# Patient Record
Sex: Female | Born: 1945 | Race: Black or African American | Hispanic: No | Smoking: Never smoker
Health system: Southern US, Community
[De-identification: ages and names within clinical notes are randomized; demographics above are authoritative.]

## PROBLEM LIST (undated history)

## (undated) DIAGNOSIS — K219 Gastro-esophageal reflux disease without esophagitis: Secondary | ICD-10-CM

## (undated) DIAGNOSIS — I10 Essential (primary) hypertension: Secondary | ICD-10-CM

## (undated) HISTORY — PX: BUNIONECTOMY: SHX129

## (undated) HISTORY — PX: ABDOMINAL HYSTERECTOMY: SHX81

---

## 2005-10-01 ENCOUNTER — Ambulatory Visit (HOSPITAL_BASED_OUTPATIENT_CLINIC_OR_DEPARTMENT_OTHER): Admission: RE | Admit: 2005-10-01 | Discharge: 2005-10-01 | Payer: Self-pay | Admitting: Orthopedic Surgery

## 2017-06-28 ENCOUNTER — Emergency Department (HOSPITAL_BASED_OUTPATIENT_CLINIC_OR_DEPARTMENT_OTHER): Payer: Medicare Other

## 2017-06-28 ENCOUNTER — Emergency Department (HOSPITAL_BASED_OUTPATIENT_CLINIC_OR_DEPARTMENT_OTHER)
Admission: EM | Admit: 2017-06-28 | Discharge: 2017-06-28 | Disposition: A | Discharge status: Home / Self Care | Payer: Medicare Other | Attending: Emergency Medicine | Admitting: Emergency Medicine

## 2017-06-28 ENCOUNTER — Encounter (HOSPITAL_BASED_OUTPATIENT_CLINIC_OR_DEPARTMENT_OTHER): Payer: Self-pay | Admitting: *Deleted

## 2017-06-28 DIAGNOSIS — Y999 Unspecified external cause status: Secondary | ICD-10-CM | POA: Insufficient documentation

## 2017-06-28 DIAGNOSIS — Y92511 Restaurant or cafe as the place of occurrence of the external cause: Secondary | ICD-10-CM | POA: Insufficient documentation

## 2017-06-28 DIAGNOSIS — S8992XA Unspecified injury of left lower leg, initial encounter: Secondary | ICD-10-CM | POA: Diagnosis not present

## 2017-06-28 DIAGNOSIS — Y939 Activity, unspecified: Secondary | ICD-10-CM | POA: Diagnosis not present

## 2017-06-28 DIAGNOSIS — W19XXXA Unspecified fall, initial encounter: Secondary | ICD-10-CM

## 2017-06-28 DIAGNOSIS — S99912A Unspecified injury of left ankle, initial encounter: Secondary | ICD-10-CM | POA: Insufficient documentation

## 2017-06-28 DIAGNOSIS — Z79899 Other long term (current) drug therapy: Secondary | ICD-10-CM | POA: Diagnosis not present

## 2017-06-28 DIAGNOSIS — S0990XA Unspecified injury of head, initial encounter: Secondary | ICD-10-CM

## 2017-06-28 DIAGNOSIS — W01198A Fall on same level from slipping, tripping and stumbling with subsequent striking against other object, initial encounter: Secondary | ICD-10-CM | POA: Insufficient documentation

## 2017-06-28 DIAGNOSIS — S098XXA Other specified injuries of head, initial encounter: Secondary | ICD-10-CM | POA: Insufficient documentation

## 2017-06-28 HISTORY — DX: Gastro-esophageal reflux disease without esophagitis: K21.9

## 2017-06-28 HISTORY — DX: Essential (primary) hypertension: I10

## 2017-06-28 NOTE — ED Triage Notes (Signed)
Fall on pavement.  Noted to have a large hematoma to her forehead.  Denies LOC. Abrasion noted with bleeding controlled.

## 2017-06-28 NOTE — ED Notes (Signed)
EDPA in to see pt, updated with results and d/c plan.

## 2017-06-28 NOTE — ED Notes (Signed)
Pt/family left after speaking with PA. Pt not visualized by this RN. Recent VSS.

## 2017-06-28 NOTE — Discharge Instructions (Signed)
Return here as needed. Follow up with your doctor. Your x-rays were normal. °

## 2017-07-02 NOTE — ED Provider Notes (Signed)
[facility] EMERGENCY DEPARTMENT Provider Note   CSN: [id number] Arrival date & time: 06/28/17  1652     History   Chief Complaint Chief Complaint  Patient presents with  . Fall    HPI Lisa Lutz is a 71 y.o. female.  HPI Patient presents to the emergency department with injuries following a fall that occurred just prior to arrival.  The patient states that she was walking into a restaurant when she tripped and fell striking her forehead onto the pavement.  Patient states that she did not lose consciousness and her family confirms this.  The patient states she is also having left ankle and knee pain.  She states she was able to ambulate following the fall.  Patient did not take any medications prior to arrival.  The patient denies chest pain, shortness of breath, headache,blurred vision, neck pain, weakness, numbness, dizziness, abdominal pain, nausea, vomiting,back pain, dysuria,near syncope, or syncope. Past Medical History:  Diagnosis Date  . Acid reflux   . Hypertension     There are no active problems to display for this patient.   Past Surgical History:  Procedure Laterality Date  . ABDOMINAL HYSTERECTOMY    . BUNIONECTOMY      OB History    No data available       Home Medications    Prior to Admission medications   Medication Sig Start Date End Date Taking? Authorizing Provider  amLODipine (NORVASC) 10 MG tablet Take 10 mg by mouth daily.   Yes [provider]  irbesartan (AVAPRO) 300 MG tablet Take 300 mg by mouth daily.   Yes [provider]  omeprazole (PRILOSEC) 40 MG capsule Take 40 mg by mouth daily.   Yes [provider]    Family History History reviewed. No pertinent family history.  Social History Social History   Tobacco Use  . Smoking status: Never Smoker  . Smokeless tobacco: Never Used  Substance Use Topics  . Alcohol use: No  . Drug use: No     Allergies   Patient has no known  allergies.   Review of Systems Review of Systems All other systems negative except as documented in the HPI. All pertinent positives and negatives as reviewed in the HPI.  Physical Exam Updated Vital Signs BP 121/72 (BP Location: Left Arm)   Pulse 66   Resp 20   SpO2 98%   Physical Exam  Constitutional: She is oriented to person, place, and time. She appears well-developed and well-nourished. No distress.  HENT:  Head: Normocephalic.    Mouth/Throat: Oropharynx is clear and moist.  Eyes: Pupils are equal, round, and reactive to light.  Neck: Normal range of motion. Neck supple.  Cardiovascular: Normal rate, regular rhythm and normal heart sounds. Exam reveals no gallop and no friction rub.  No murmur heard. Pulmonary/Chest: Effort normal and breath sounds normal. No respiratory distress. She has no wheezes.  Abdominal: Soft. Bowel sounds are normal. She exhibits no distension. There is no tenderness.  Musculoskeletal:       Left knee: She exhibits normal range of motion, no swelling, no effusion and no ecchymosis. Tenderness found.       Left ankle: She exhibits normal range of motion, no swelling and no ecchymosis. Tenderness.  Neurological: She is alert and oriented to person, place, and time. She exhibits normal muscle tone. Coordination normal.  Skin: Skin is warm and dry. Capillary refill takes less than 2 seconds. No rash noted. No erythema.  Psychiatric:  She has a normal mood and affect. Her behavior is normal.  Nursing note and vitals reviewed.    ED Treatments / Results  Labs (all labs ordered are listed, but only abnormal results are displayed) Labs Reviewed - No data to display  EKG  EKG Interpretation None       Radiology No results found.  Procedures Procedures (including critical care time)  Medications Ordered in ED Medications - No data to display   Initial Impression / Assessment and Plan / ED Course  I have reviewed the triage vital signs  and the nursing notes.  Pertinent labs & imaging results that were available during my care of the patient were reviewed by me and considered in my medical decision making (see chart for details).     Patient has negative imaging and her exam did not show any signs of neurological deficits.  Patient has full range of motion of her hip on the left.  The patient is advised to follow-up with her primary care doctor told to return here as needed.  Final Clinical Impressions(s) / ED Diagnoses   Final diagnoses:  Fall  Minor head injury, initial encounter  Injury of left knee, initial encounter  Injury of left ankle, initial encounter    ED Discharge Orders    None       Charlestine NightLawyer, Pleas Carneal, PA-C 07/02/17 [phone]    Cathren LaineSteinl, Kevin, MD 07/04/17 1241

## 2018-01-17 ENCOUNTER — Encounter (HOSPITAL_BASED_OUTPATIENT_CLINIC_OR_DEPARTMENT_OTHER): Payer: Self-pay | Admitting: Emergency Medicine

## 2018-01-17 ENCOUNTER — Other Ambulatory Visit: Payer: Self-pay

## 2018-01-17 ENCOUNTER — Emergency Department (HOSPITAL_BASED_OUTPATIENT_CLINIC_OR_DEPARTMENT_OTHER)
Admission: EM | Admit: 2018-01-17 | Discharge: 2018-01-17 | Disposition: A | Discharge status: Home / Self Care | Payer: Medicare Other | Attending: Emergency Medicine | Admitting: Emergency Medicine

## 2018-01-17 DIAGNOSIS — Z79899 Other long term (current) drug therapy: Secondary | ICD-10-CM | POA: Diagnosis not present

## 2018-01-17 DIAGNOSIS — I10 Essential (primary) hypertension: Secondary | ICD-10-CM | POA: Diagnosis not present

## 2018-01-17 DIAGNOSIS — R21 Rash and other nonspecific skin eruption: Secondary | ICD-10-CM | POA: Diagnosis present

## 2018-01-17 MED ORDER — METHYLPREDNISOLONE SODIUM SUCC 125 MG IJ SOLR
80.0000 mg | Freq: Once | INTRAMUSCULAR | Status: AC
  Administered 2018-01-17: 80 mg via INTRAMUSCULAR
  Filled 2018-01-17: qty 2

## 2018-01-17 MED ORDER — PREDNISONE 10 MG PO TABS: 20.0000 mg | ORAL_TABLET | Freq: Two times a day (BID) | ORAL | 0 refills | Status: AC

## 2018-01-17 MED ORDER — DIPHENHYDRAMINE HCL 25 MG PO TABS: 25.0000 mg | ORAL_TABLET | Freq: Four times a day (QID) | ORAL | 0 refills | Status: AC | PRN

## 2018-01-17 MED ORDER — FAMOTIDINE 20 MG PO TABS
20.0000 mg | ORAL_TABLET | Freq: Once | ORAL | Status: AC
  Administered 2018-01-17: 20 mg via ORAL
  Filled 2018-01-17: qty 1

## 2018-01-17 MED ORDER — FAMOTIDINE 20 MG PO TABS: 20.0000 mg | ORAL_TABLET | Freq: Two times a day (BID) | ORAL | 0 refills | Status: AC | PRN

## 2018-01-17 MED ORDER — METHYLPREDNISOLONE SODIUM SUCC 125 MG IJ SOLR: 80.0000 mg | Freq: Once | INTRAMUSCULAR | Status: DC

## 2018-01-17 NOTE — ED Provider Notes (Signed)
[facility] EMERGENCY DEPARTMENT Provider Note   CSN: [id number] Arrival date & time: 01/17/18  1053     History   Chief Complaint Chief Complaint  Patient presents with  . Allergic Reaction    HPI Ernestyne Caldwell is a 72 y.o. female with history of GERD, hypertension, hyperlipidemia, hiatal hernia, CKD, anemia, arthritis, osteopenia, and adenopathy presents for evaluation of acute onset, per aggressively worsening pruritic rash since yesterday.  She states that she noticed the rash to her extremities and chest and back yesterday but when she awoke this morning the rash had worsened.  She denies tenderness, fevers, chills, nausea, vomiting, difficulty breathing or swallowing, or swelling of the lips or tongue.  She has not tried anything for her symptoms.  She denies any new soaps, shampoos, detergents, or lotions.  No one at home with similar rash.  Of note, she was recently treated for lymphadenopathy with a 7-day course of clindamycin after being seen by her primary care physician on 01/05/2018.  She states that she completed the clindamycin several days ago but has never been on it in the past to her knowledge.   The history is provided by the patient.    Past Medical History:  Diagnosis Date  . Acid reflux   . Hypertension     There are no active problems to display for this patient.   Past Surgical History:  Procedure Laterality Date  . ABDOMINAL HYSTERECTOMY    . BUNIONECTOMY       OB History   None      Home Medications    Prior to Admission medications   Medication Sig Start Date End Date Taking? Authorizing Provider  amLODipine (NORVASC) 10 MG tablet Take 10 mg by mouth daily.    [provider]  diphenhydrAMINE (BENADRYL) 25 MG tablet Take 1 tablet (25 mg total) by mouth every 6 (six) hours as needed for itching. 01/17/18   Luevenia Maxin, Deveney Bayon A, PA-C  famotidine (PEPCID) 20 MG tablet Take 1 tablet (20 mg total) by mouth 2 (two) times  daily as needed (itching). 01/17/18   Keierra Nudo A, PA-C  irbesartan (AVAPRO) 300 MG tablet Take 300 mg by mouth daily.    [provider]  omeprazole (PRILOSEC) 40 MG capsule Take 40 mg by mouth daily.    [provider]  predniSONE (DELTASONE) 10 MG tablet Take 2 tablets (20 mg total) by mouth 2 (two) times daily with a meal for 5 days. 01/17/18 01/22/18  Jeanie Sewer, PA-C    Family History No family history on file.  Social History Social History   Tobacco Use  . Smoking status: Never Smoker  . Smokeless tobacco: Never Used  Substance Use Topics  . Alcohol use: No  . Drug use: No     Allergies   Patient has no known allergies.   Review of Systems Review of Systems  Constitutional: Negative for chills and fever.  Respiratory: Negative for shortness of breath.   Cardiovascular: Negative for chest pain.  Gastrointestinal: Negative for abdominal pain, nausea and vomiting.  Skin: Positive for rash.     Physical Exam Updated Vital Signs BP (!) 160/69 (BP Location: Right Arm)   Pulse 62   Temp 98 F (36.7 C) (Oral)   Resp 16   Ht 5' (1.524 m)   Wt 76.7 kg (169 lb)   SpO2 99%   BMI 33.01 kg/m   Physical Exam  Constitutional: She appears well-developed and well-nourished. No distress.  HENT:  Head: Normocephalic and atraumatic.  No swelling of the lips, tongue, or uvula.  Tolerating secretions without difficulty.  Eyes: Pupils are equal, round, and reactive to light. Conjunctivae and EOM are normal. Right eye exhibits no discharge. Left eye exhibits no discharge.  Neck: Normal range of motion. Neck supple. No JVD present. No tracheal deviation present.  No upper airway stridor  Cardiovascular: Normal rate, regular rhythm and normal heart sounds.  Pulmonary/Chest: Effort normal and breath sounds normal. She has no wheezes. She has no rales. She exhibits no tenderness.  Equal rise and fall of chest, no increased work of breathing.  Speaking in full  sentences without difficulty.  Abdominal: Soft. Bowel sounds are normal. She exhibits no distension. There is no tenderness.  Musculoskeletal: She exhibits no edema.  Neurological: She is alert.  Skin: Skin is warm and dry. Rash noted. No erythema.  Erythematous urticarial rash noted to the extremities, chest, abdomen, back, and thighs bilaterally.  Spares the palms and soles.  Psychiatric: She has a normal mood and affect. Her behavior is normal.  Nursing note and vitals reviewed.    ED Treatments / Results  Labs (all labs ordered are listed, but only abnormal results are displayed) Labs Reviewed - No data to display  EKG None  Radiology No results found.  Procedures Procedures (including critical care time)  Medications Ordered in ED Medications  methylPREDNISolone sodium succinate (SOLU-MEDROL) 125 mg/2 mL injection 80 mg (80 mg Intramuscular Given 01/17/18 1418)  famotidine (PEPCID) tablet 20 mg (20 mg Oral Given 01/17/18 1417)     Initial Impression / Assessment and Plan / ED Course  I have reviewed the triage vital signs and the nursing notes.  Pertinent labs & imaging results that were available during my care of the patient were reviewed by me and considered in my medical decision making (see chart for details).    Rash consistent with urticaria, possibly viral exanthem related to lymphadenopathy she was seen for on 01/05/2018 by her primary care physician.  The patient is afebrile, vital signs are at patient's baseline.  She is nontoxic in appearance.  Patient denies any difficulty breathing or swallowing.  Pt has a patent airway without stridor and is handling secretions without difficulty; no angioedema. No blisters, no pustules, no warmth, no draining sinus tracts, no superficial abscesses, no bullous impetigo, no vesicles, no desquamation, no target lesions with dusky purpura or a central bulla. Not tender to touch. No concern for superimposed infection. No concern for  SJS, TEN, TSS, tick borne illness, syphilis or other life-threatening condition. Will discharge home with short course of steroids, pepcid and recommend Benadryl as needed for pruritis.  Discussed strict ED return precautions.  Recommend follow-up with her primary care physician or dermatologist for reevaluation. Pt verbalized understanding of and agreement with plan and is safe for discharge home at this time.  Patient seen and evaluated by Dr. Madilyn Hook who agrees with assessment and plan at this time.  Final Clinical Impressions(s) / ED Diagnoses   Final diagnoses:  Rash    ED Discharge Orders        Ordered    predniSONE (DELTASONE) 10 MG tablet  2 times daily with meals     01/17/18 1434    diphenhydrAMINE (BENADRYL) 25 MG tablet  Every 6 hours PRN     01/17/18 1434    famotidine (PEPCID) 20 MG tablet  2 times daily PRN     01/17/18 1434  Jeanie Sewer, PA-C 01/17/18 1435    Tilden Fossa, MD 01/18/18 [phone]

## 2018-01-17 NOTE — ED Triage Notes (Signed)
Pt has hives all over her body. She is itching. Denies SOB or oral swelling. Pt has been taking clindamycin for a week, unsure if this is related. Reports no other new products used.

## 2018-01-17 NOTE — Discharge Instructions (Signed)
Start taking prednisone as prescribed beginning tomorrow you received the first dose in the emergency department today.  You may take Benadryl every 6 hours as needed for itching but be aware this medication may make you drowsy.  You should avoid driving, drinking alcohol, or operating heavy machinery if it does.  You may also take Pepcid twice daily in addition to Benadryl for itching but do not take your Prilosec if you are going to do this.  Follow-up with your primary care physician for reevaluation of your rash.  Return to the emergency department immediately for any concerning signs or symptoms develop such as swelling of the lips or tongue, difficulty breathing or swallowing, vomiting, or fevers.

## 2018-07-09 IMAGING — DX DG ANKLE COMPLETE 3+V*L*
3 series · 3 of 3 positions shown · non-contrast
Comparison: None.

CLINICAL DATA: Fell today. Left forehead hematoma. Pain with
ambulation.

EXAM:
LEFT ANKLE COMPLETE - 3+ VIEW

[ankle ap]
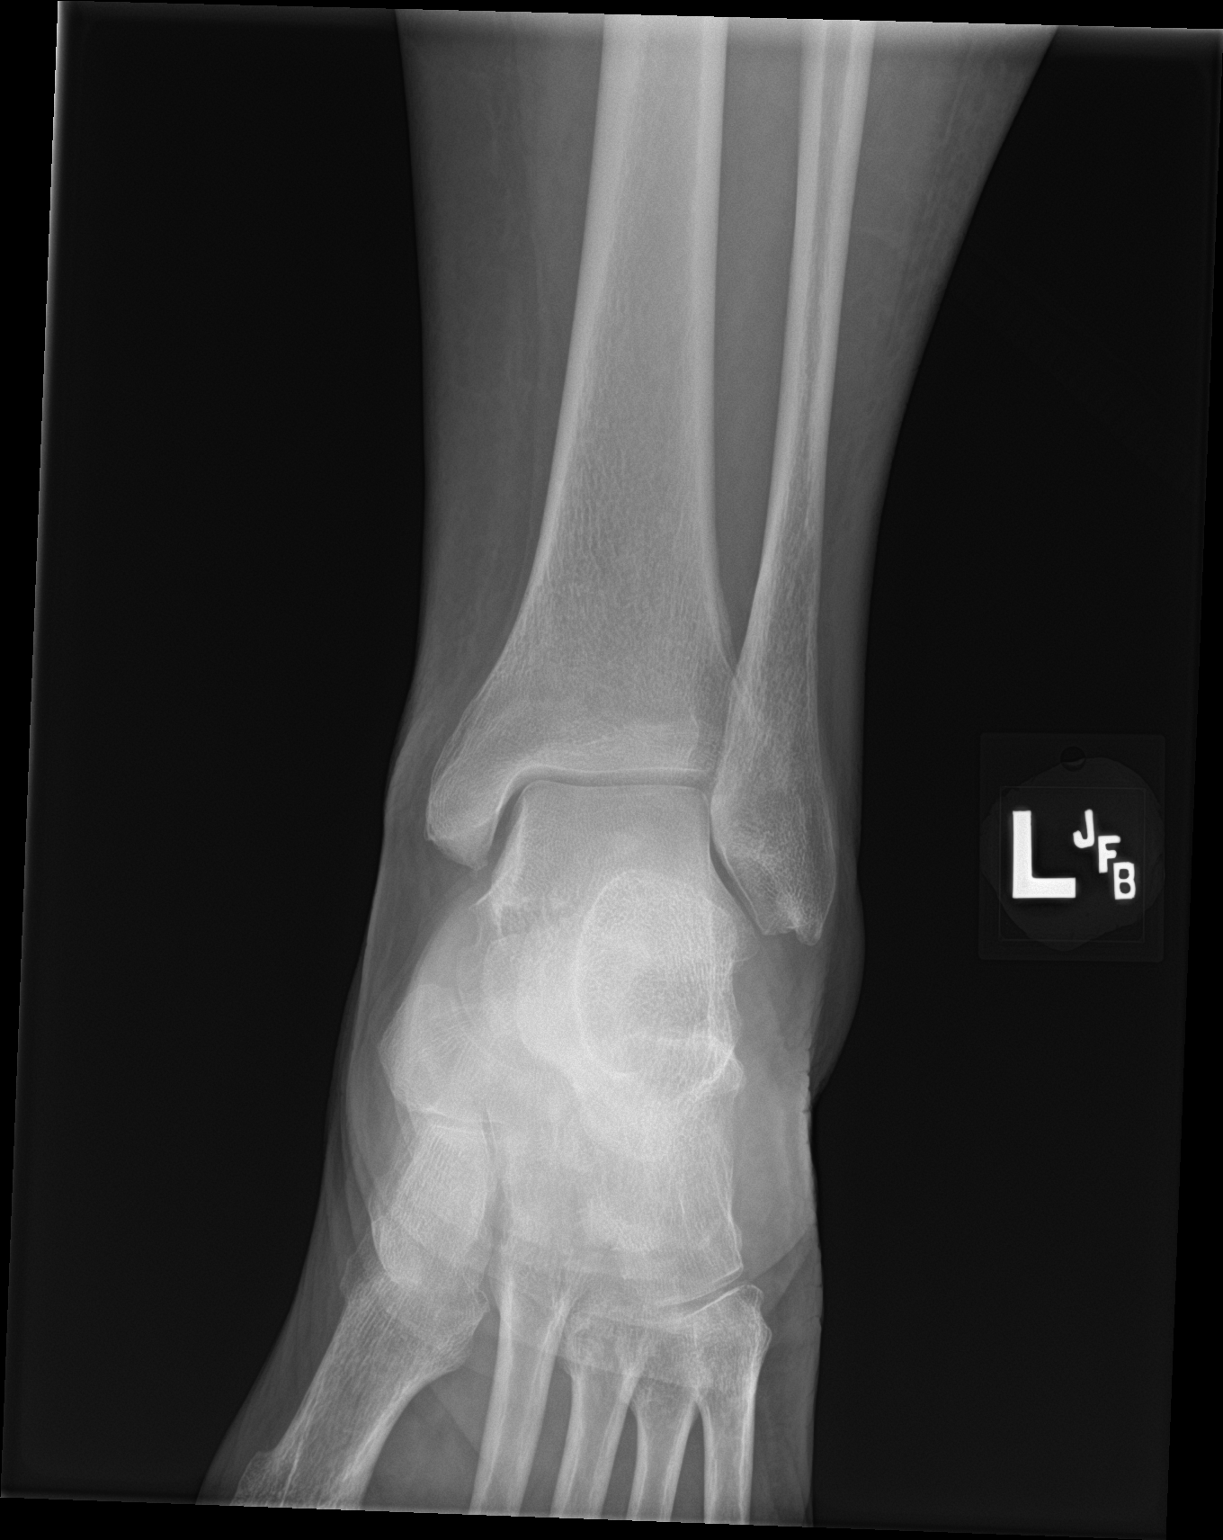

[ankle obl]
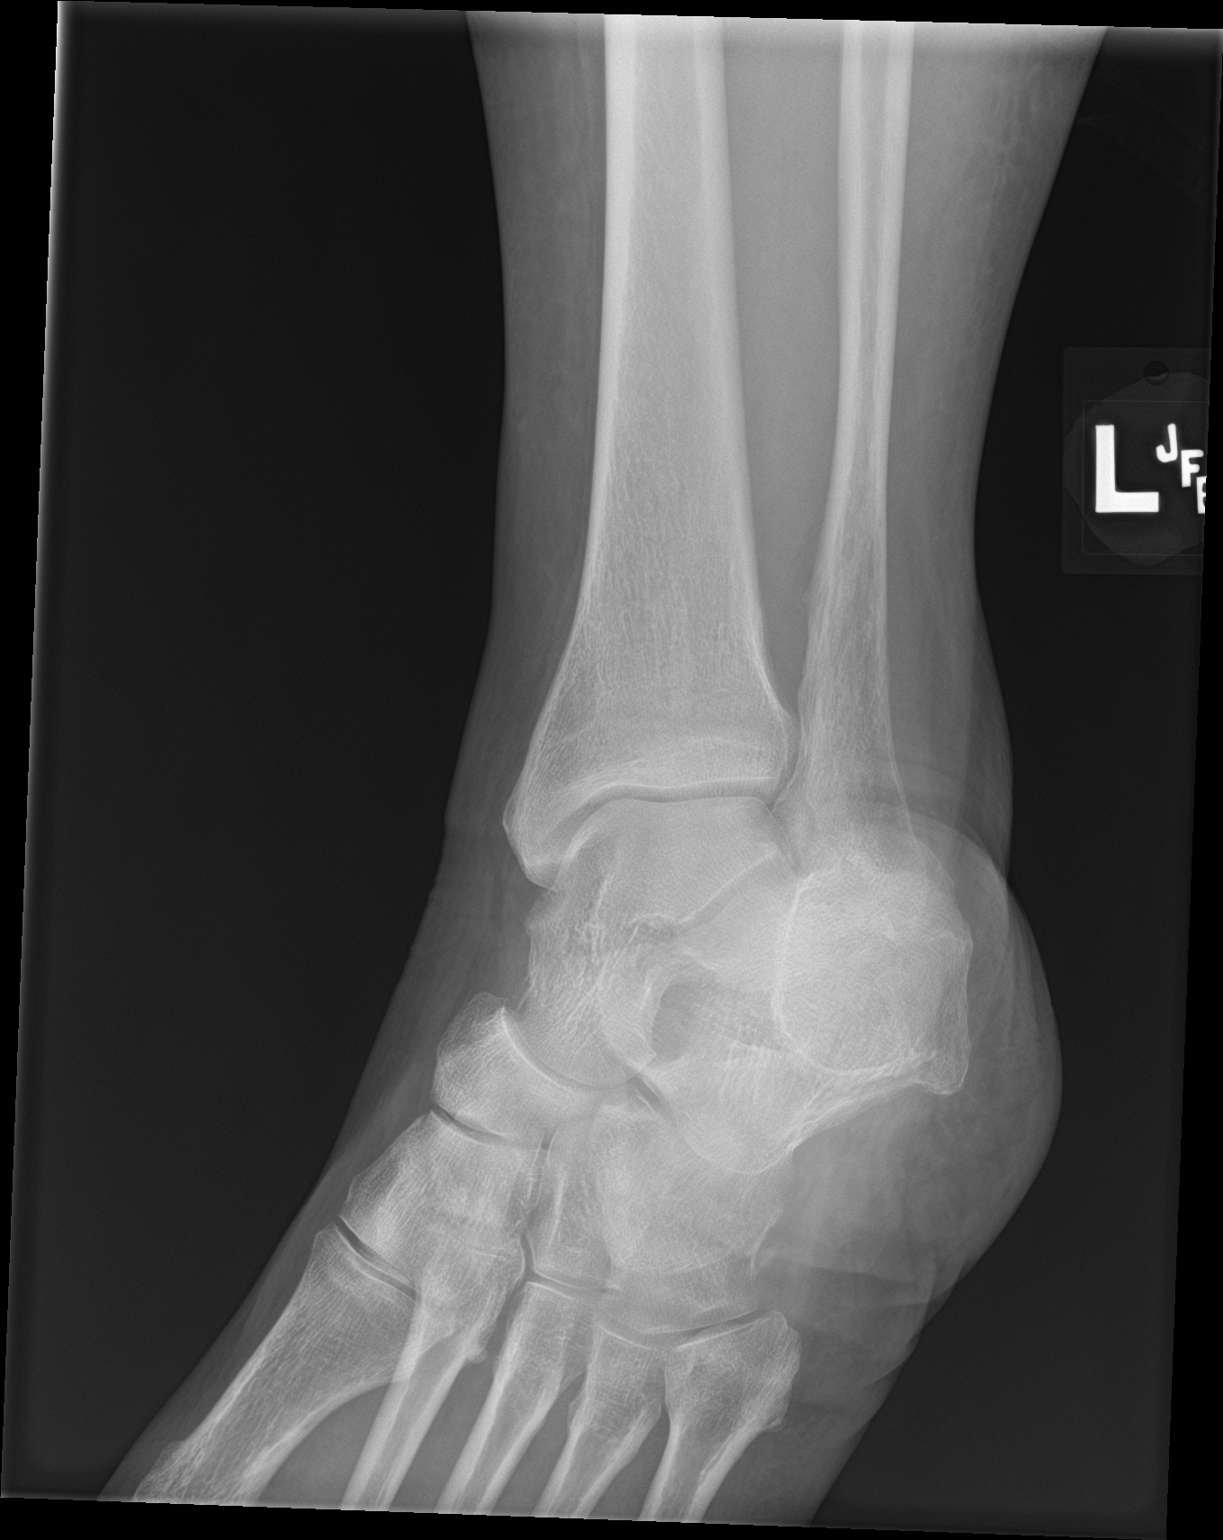

[ankle lat]
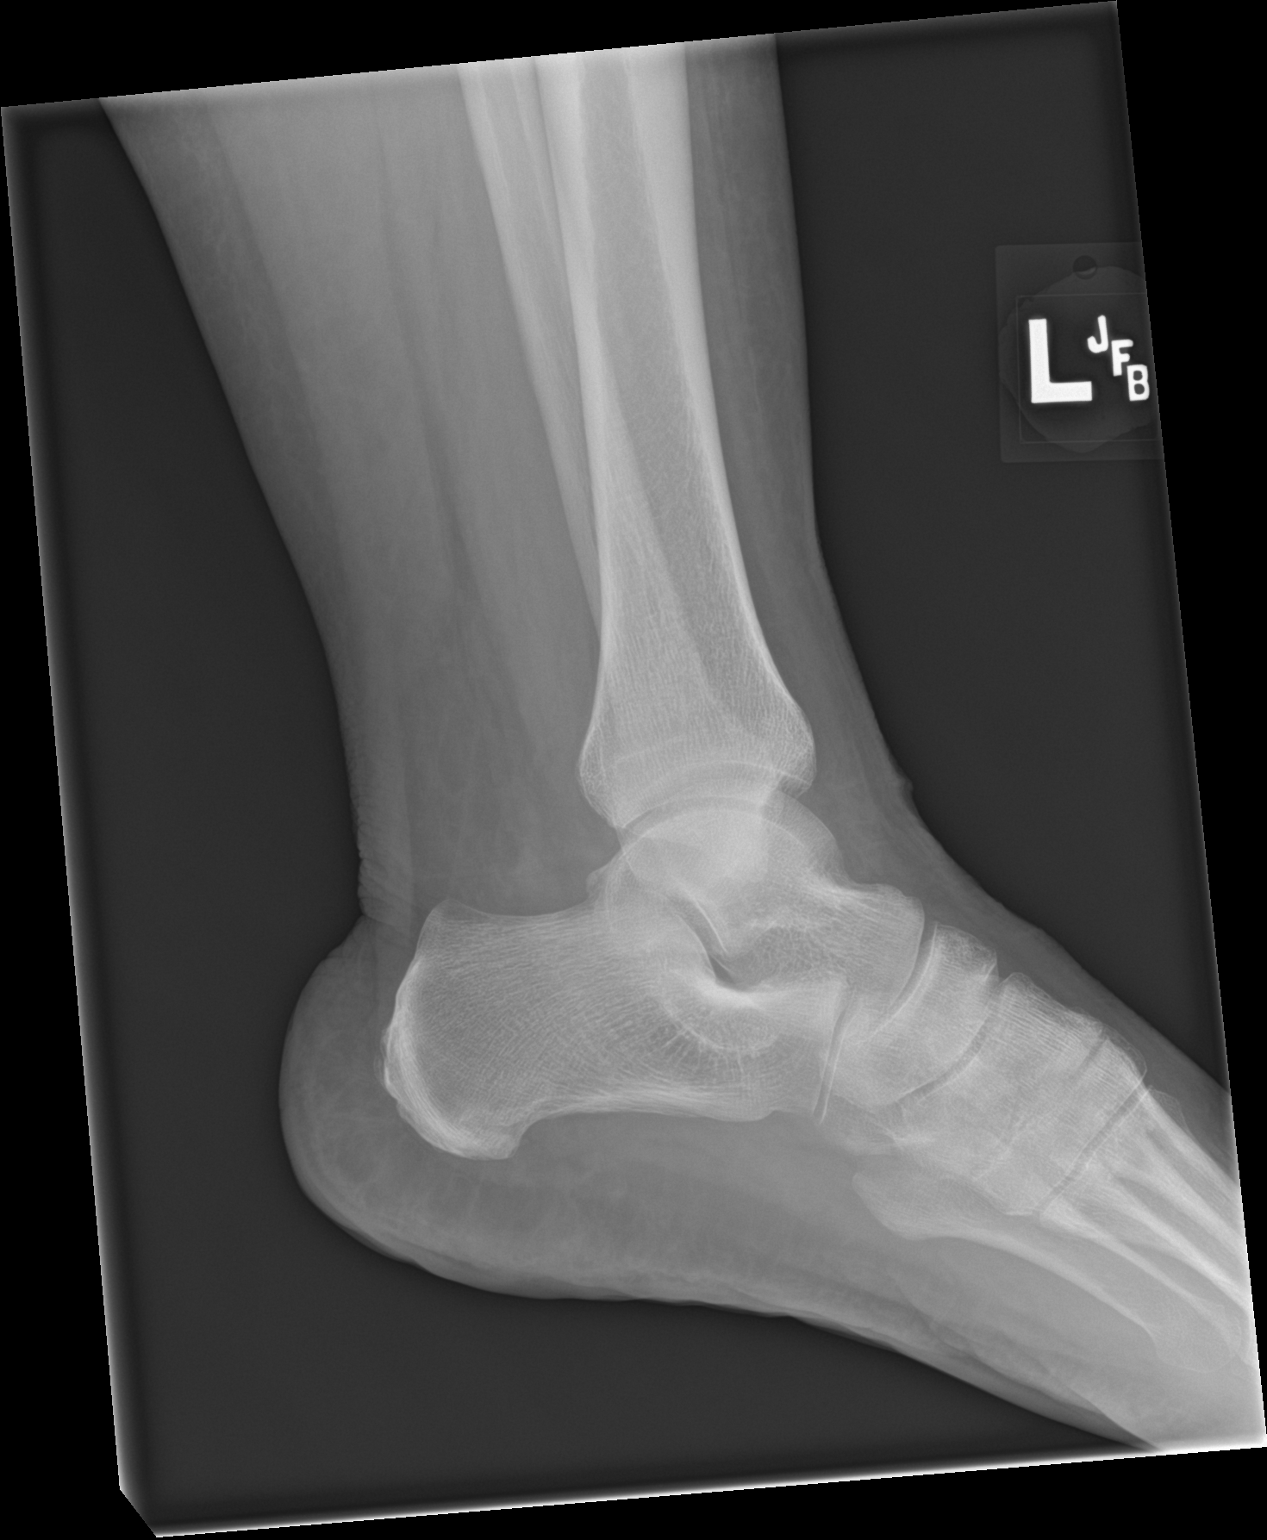

[3 of 3 positions shown; findings below may reference images not displayed]

FINDINGS: There is no evidence of fracture, dislocation, or joint effusion.
There is no evidence of arthropathy or other focal bone abnormality.
Soft tissues are unremarkable.
IMPRESSION: Negative.

## 2018-07-09 IMAGING — CT CT CERVICAL SPINE W/O CM
4 of 7 series · 13 of 33 positions shown, 15 images · non-contrast
Comparison: None.

CLINICAL DATA: Left forehead hematoma after trauma.

EXAM:
CT HEAD WITHOUT CONTRAST
CT CERVICAL SPINE WITHOUT CONTRAST
TECHNIQUE: Multidetector CT imaging of the head and cervical spine was
performed following the standard protocol without intravenous
contrast. Multiplanar CT image reconstructions of the cervical spine
were also generated.

[Series 7: c_spine 2.0 i30s 3 · axial · 0.34mm/px · z∈[-87,-49]mm · 2 of 78 slices shown]
[im 20/78  bone]
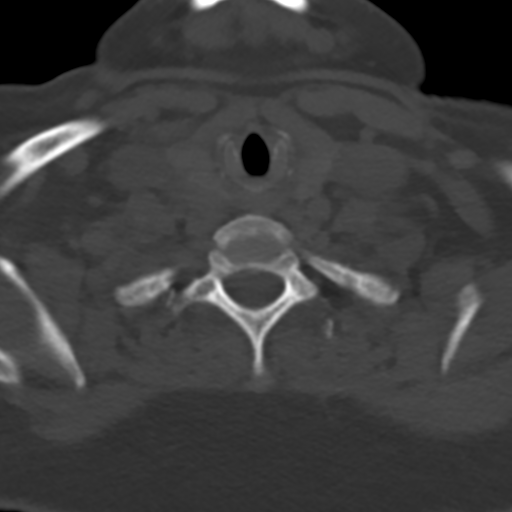
[im 39/78  bone]
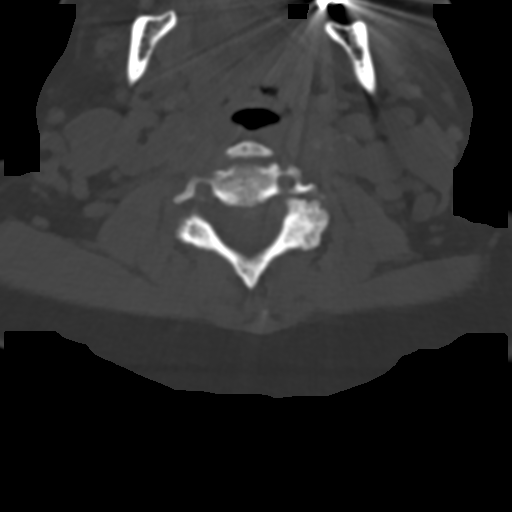

[Series 9: coronals · coronal · 0.24mm/px · 1 of 48 slices shown]
[im 24/48  bone]
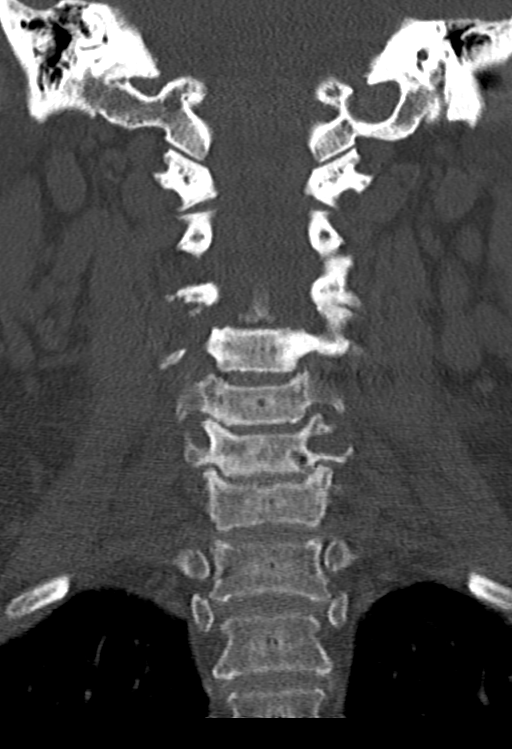

[Series 10: sagittals · sagittal · 0.24mm/px · 5 of 79 slices shown]
[im 12/79  bone]
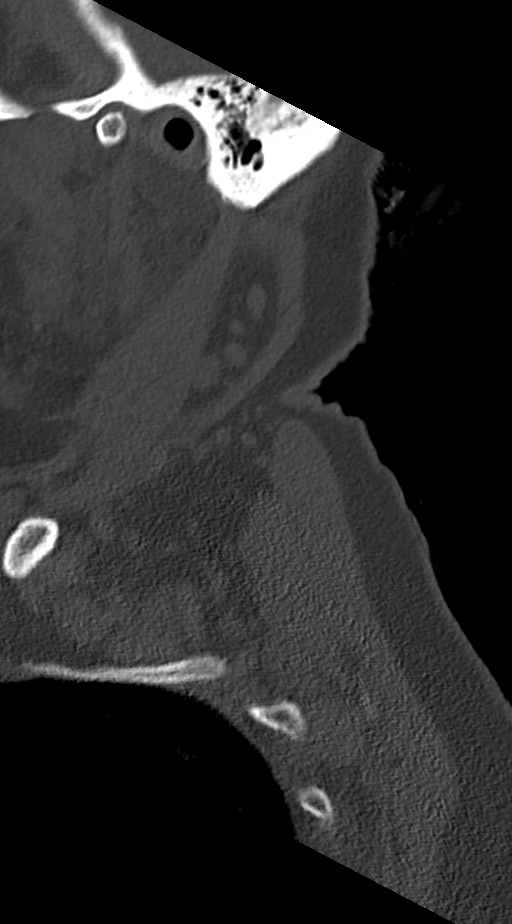
[im 23/79  bone]
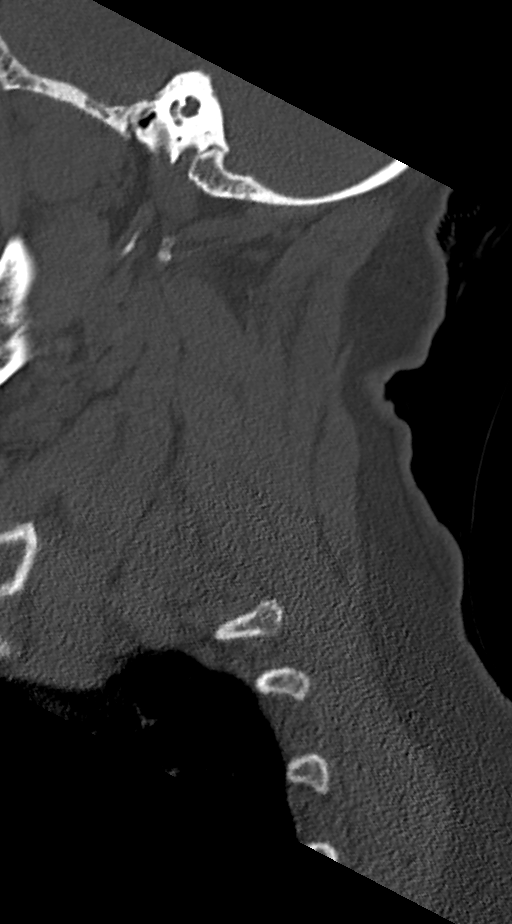
[im 34/79  bone]
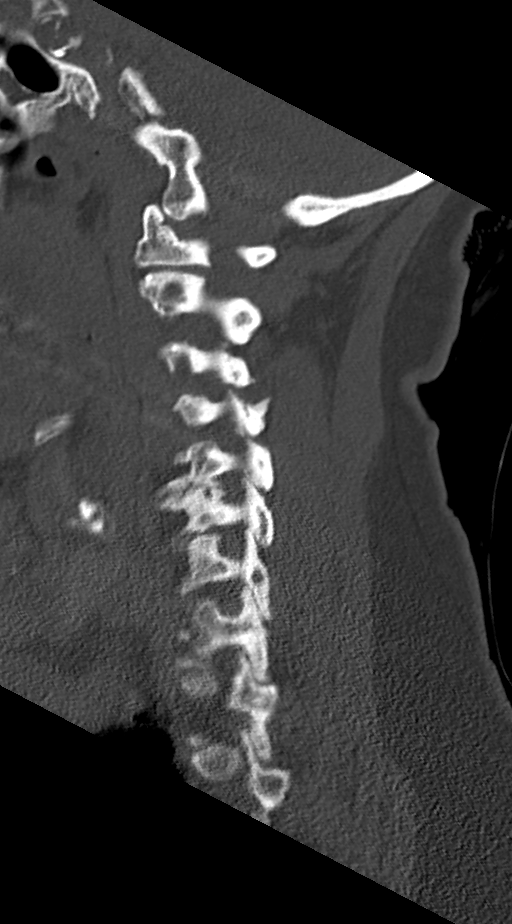
[im 45/79  bone]
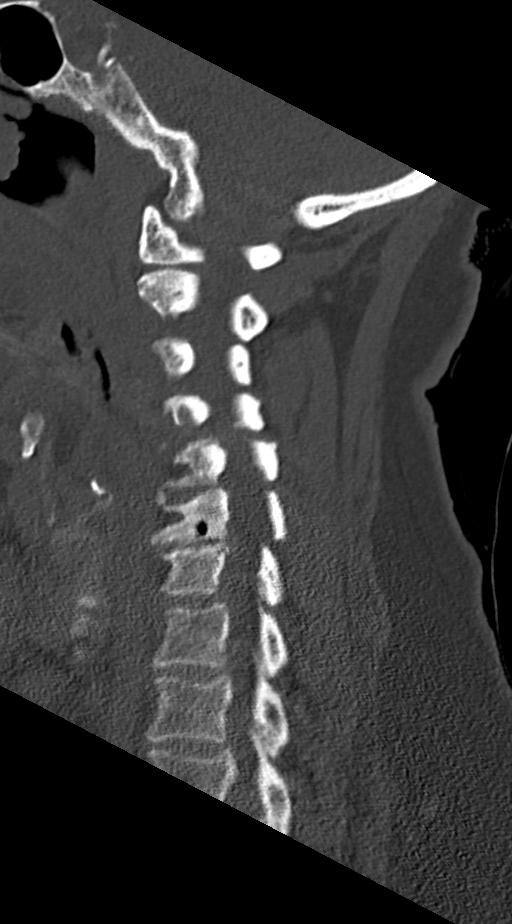
[im 56/79  bone]
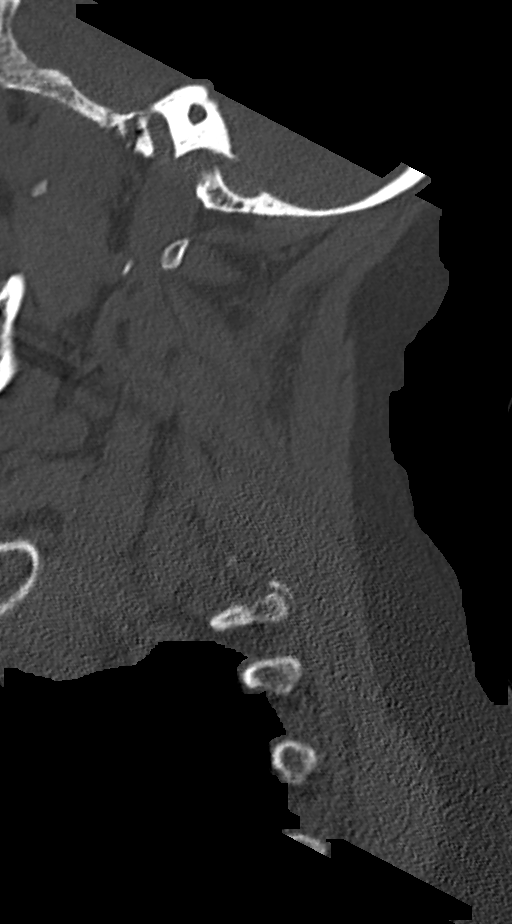

[Series 11: orthogonals · axial · 0.23mm/px · z∈[-144,-22]mm · 5 of 104 slices shown, 7 images]
[im 18/104  soft-tissue]
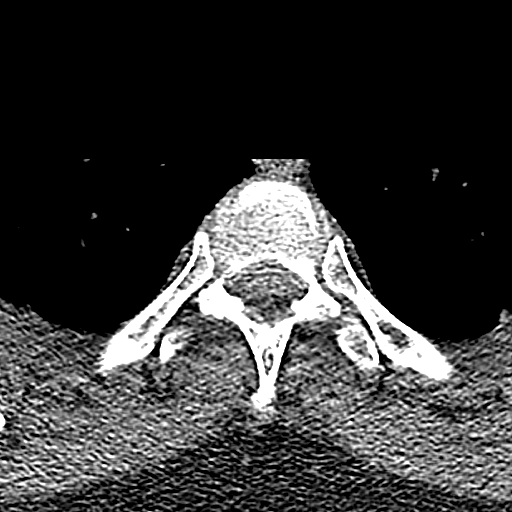
[im 18/104  bone]
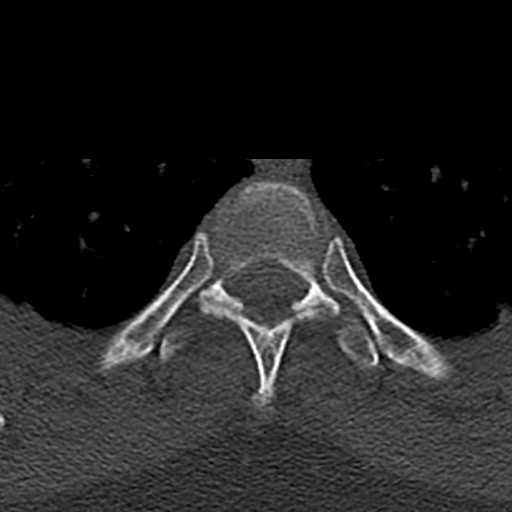
[im 35/104  bone]
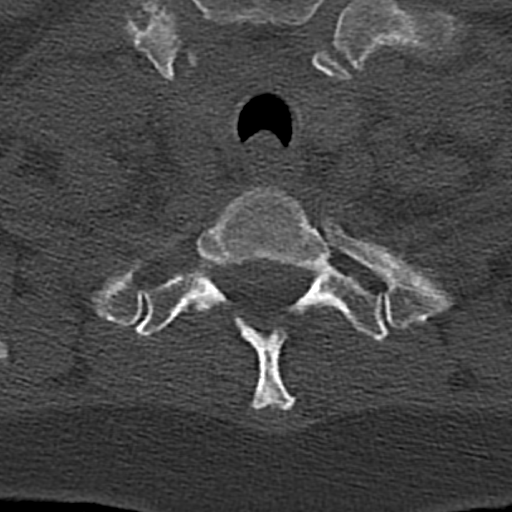
[im 52/104  bone]
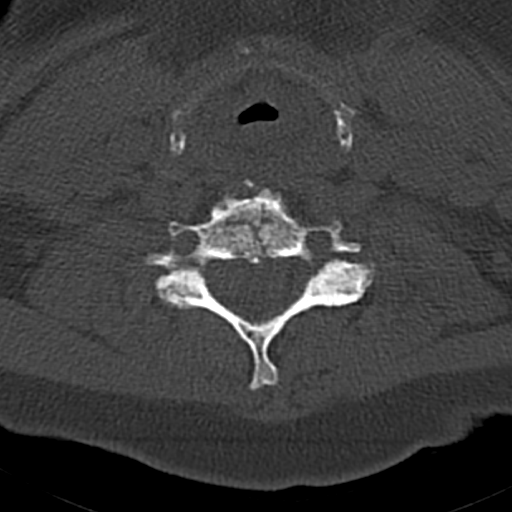
[im 69/104  bone]
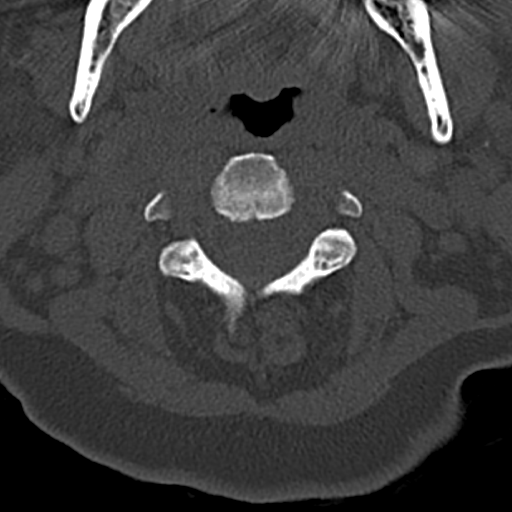
[im 86/104  soft-tissue]
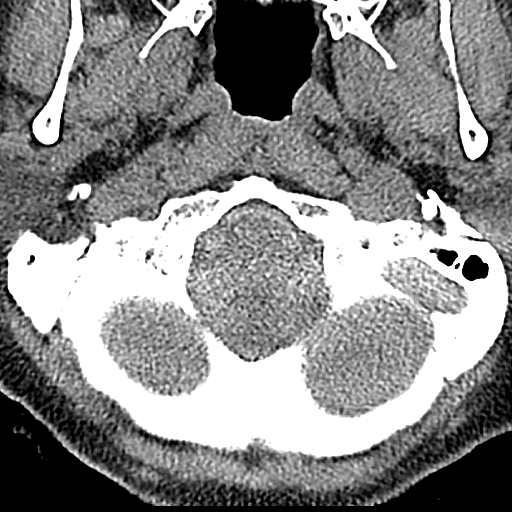
[im 86/104  bone]
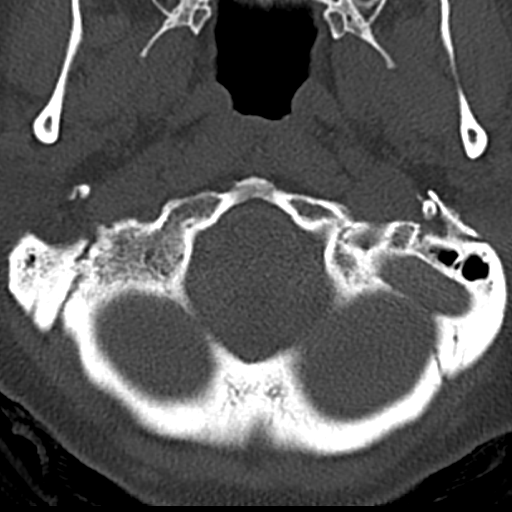

[13 of 33 positions shown; findings below may reference images not displayed]

FINDINGS: CT HEAD FINDINGS

Brain: No subdural, epidural, or subarachnoid hemorrhage.
Cerebellum, brainstem, and basal cisterns are normal. Ventricles and
sulci are normal. No acute cortical ischemia or infarct. No mass
effect or midline shift.

Vascular: Calcified atherosclerotic disease in the intracranial
portions of the carotid arteries.

Skull: Normal. Negative for fracture or focal lesion.

Sinuses/Orbits: No acute finding.

Other: There is a hematoma over the left forehead. Extracranial soft
tissues are otherwise normal.

CT CERVICAL SPINE FINDINGS

Alignment: There is 3 mm of anterolisthesis of C3 versus C4. There
is 2.4 mm of anterolisthesis of C4 versus C5 no other malalignment
identified.

Skull base and vertebrae: No fractures are identified.

Soft tissues and spinal canal: No prevertebral fluid or swelling. No
visible canal hematoma.

Disc levels: Multilevel degenerative disc disease most prominent at
C5-6 and C6-7. Multilevel facet degenerative changes.

Upper chest: Mild emphysematous changes in the apices.

Other: No other abnormalities.
IMPRESSION: 1. No acute intracranial abnormality.
2. 3 mm of anterolisthesis of C3 versus C4 and 2.4 mm of
anterolisthesis of C4 versus C5. This finding is indeterminate but
may be due to facet degenerative changes at these levels. If there
is concern for ligamentous injury, an MRI would be more sensitive.
3. No cervical spine fracture identified.
4. Multilevel degenerative changes in the cervical spine.
